# Patient Record
Sex: Female | Born: 1955 | Race: White | Hispanic: No | Smoking: Former smoker
Health system: Southern US, Community
[De-identification: ages and names within clinical notes are randomized; demographics above are authoritative.]

## PROBLEM LIST (undated history)

## (undated) DIAGNOSIS — F419 Anxiety disorder, unspecified: Secondary | ICD-10-CM

## (undated) DIAGNOSIS — M1990 Unspecified osteoarthritis, unspecified site: Secondary | ICD-10-CM

## (undated) DIAGNOSIS — E039 Hypothyroidism, unspecified: Secondary | ICD-10-CM

## (undated) DIAGNOSIS — F32A Depression, unspecified: Secondary | ICD-10-CM

## (undated) DIAGNOSIS — J849 Interstitial pulmonary disease, unspecified: Secondary | ICD-10-CM

## (undated) DIAGNOSIS — J449 Chronic obstructive pulmonary disease, unspecified: Secondary | ICD-10-CM

## (undated) HISTORY — DX: Interstitial pulmonary disease, unspecified: J84.9

## (undated) HISTORY — DX: Hypothyroidism, unspecified: E03.9

## (undated) HISTORY — DX: Chronic obstructive pulmonary disease, unspecified: J44.9

## (undated) HISTORY — PX: OTHER SURGICAL HISTORY: SHX169

## (undated) HISTORY — PX: HIP FRACTURE SURGERY: SHX118

## (undated) HISTORY — DX: Anxiety disorder, unspecified: F41.9

## (undated) HISTORY — DX: Depression, unspecified: F32.A

## (undated) HISTORY — PX: ABDOMINAL HYSTERECTOMY: SHX81

## (undated) HISTORY — DX: Unspecified osteoarthritis, unspecified site: M19.90

---

## 2019-11-30 DIAGNOSIS — H903 Sensorineural hearing loss, bilateral: Secondary | ICD-10-CM | POA: Insufficient documentation

## 2019-12-29 DIAGNOSIS — J449 Chronic obstructive pulmonary disease, unspecified: Secondary | ICD-10-CM | POA: Insufficient documentation

## 2020-02-02 ENCOUNTER — Other Ambulatory Visit: Payer: Self-pay

## 2020-02-02 ENCOUNTER — Emergency Department (HOSPITAL_COMMUNITY)
Admission: EM | Admit: 2020-02-02 | Discharge: 2020-02-03 | Disposition: A | Discharge status: Home / Self Care | Payer: Medicare HMO | Attending: Emergency Medicine | Admitting: Emergency Medicine

## 2020-02-02 DIAGNOSIS — R42 Dizziness and giddiness: Secondary | ICD-10-CM | POA: Diagnosis present

## 2020-02-02 DIAGNOSIS — Z7982 Long term (current) use of aspirin: Secondary | ICD-10-CM | POA: Diagnosis not present

## 2020-02-02 DIAGNOSIS — R0602 Shortness of breath: Secondary | ICD-10-CM | POA: Insufficient documentation

## 2020-02-02 DIAGNOSIS — Z79899 Other long term (current) drug therapy: Secondary | ICD-10-CM | POA: Diagnosis not present

## 2020-02-02 DIAGNOSIS — J189 Pneumonia, unspecified organism: Secondary | ICD-10-CM | POA: Insufficient documentation

## 2020-02-02 LAB — COMPREHENSIVE METABOLIC PANEL
ALT: 14 U/L (ref 0–44)
AST: 20 U/L (ref 15–41)
Albumin: 3.7 g/dL (ref 3.5–5.0)
Alkaline Phosphatase: 58 U/L (ref 38–126)
Anion gap: 11 (ref 5–15)
BUN: 11 mg/dL (ref 8–23)
CO2: 22 mmol/L (ref 22–32)
Calcium: 8.2 mg/dL — ABNORMAL LOW (ref 8.9–10.3)
Chloride: 107 mmol/L (ref 98–111)
Creatinine, Ser: 1.06 mg/dL — ABNORMAL HIGH (ref 0.44–1.00)
GFR calc Af Amer: >60 mL/min (ref >60)
GFR calc non Af Amer: 55 mL/min — ABNORMAL LOW (ref >60)
Glucose, Bld: 97 mg/dL (ref 70–99)
Potassium: 4.2 mmol/L (ref 3.5–5.1)
Sodium: 140 mmol/L (ref 135–145)
Total Bilirubin: 0.8 mg/dL (ref 0.3–1.2)
Total Protein: 6.2 g/dL — ABNORMAL LOW (ref 6.5–8.1)

## 2020-02-02 LAB — CBC WITH DIFFERENTIAL/PLATELET
Abs Immature Granulocytes: 0.05 10*3/uL (ref 0.00–0.07)
Basophils Absolute: 0 10*3/uL (ref 0.0–0.1)
Basophils Relative: 0 %
Eosinophils Absolute: 0.5 10*3/uL (ref 0.0–0.5)
Eosinophils Relative: 4 %
HCT: 33.8 % — ABNORMAL LOW (ref 36.0–46.0)
Hemoglobin: 11 g/dL — ABNORMAL LOW (ref 12.0–15.0)
Immature Granulocytes: 0 %
Lymphocytes Relative: 20 %
Lymphs Abs: 2.5 10*3/uL (ref 0.7–4.0)
MCH: 31.8 pg (ref 26.0–34.0)
MCHC: 32.5 g/dL (ref 30.0–36.0)
MCV: 97.7 fL (ref 80.0–100.0)
Monocytes Absolute: 0.7 10*3/uL (ref 0.1–1.0)
Monocytes Relative: 6 %
Neutro Abs: 8.4 10*3/uL — ABNORMAL HIGH (ref 1.7–7.7)
Neutrophils Relative %: 70 %
Platelets: 191 10*3/uL (ref 150–400)
RBC: 3.46 MIL/uL — ABNORMAL LOW (ref 3.87–5.11)
RDW: 14.5 % (ref 11.5–15.5)
WBC: 12.1 10*3/uL — ABNORMAL HIGH (ref 4.0–10.5)
nRBC: 0 % (ref 0.0–0.2)

## 2020-02-02 LAB — URINALYSIS, ROUTINE W REFLEX MICROSCOPIC
Bilirubin Urine: NEGATIVE
Glucose, UA: NEGATIVE mg/dL
Hgb urine dipstick: NEGATIVE
Ketones, ur: NEGATIVE mg/dL
Leukocytes,Ua: NEGATIVE
Nitrite: NEGATIVE
Protein, ur: NEGATIVE mg/dL
Specific Gravity, Urine: 1.013 (ref 1.005–1.030)
pH: 5 (ref 5.0–8.0)

## 2020-02-02 MED ORDER — SODIUM CHLORIDE 0.9 % IV BOLUS (SEPSIS)
1000.0000 mL | Freq: Once | INTRAVENOUS | Status: AC
  Administered 2020-02-03: 1000 mL via INTRAVENOUS

## 2020-02-02 NOTE — ED Triage Notes (Signed)
PER EMS: Pt is coming from home with c/o dizziness for the past two months. Pt states the dizziness has gotten worse since she was diagnosed with a UTI and pneumonia one month ago. States she was prescribed antibiotics and did not finish the entire prescription. Continues to have difficultly urinating and reports falling more frequently. Dyspnea with exertion. A&Ox4 and ambulatory without assistive device. 3L of oxygen at baseline.   EMS VITALS: BP 124/70 HR 88 SPO2 97% 3L  CBG 101 TEMP 97.3

## 2020-02-03 ENCOUNTER — Emergency Department (HOSPITAL_COMMUNITY): Payer: Medicare HMO

## 2020-02-03 DIAGNOSIS — J189 Pneumonia, unspecified organism: Secondary | ICD-10-CM | POA: Diagnosis not present

## 2020-02-03 LAB — LACTIC ACID, PLASMA: Lactic Acid, Venous: 0.6 mmol/L (ref 0.5–1.9)

## 2020-02-03 LAB — ETHANOL: Alcohol, Ethyl (B): <10 mg/dL (ref <10)

## 2020-02-03 LAB — ACETAMINOPHEN LEVEL: Acetaminophen (Tylenol), Serum: <10 ug/mL — ABNORMAL LOW (ref 10–30)

## 2020-02-03 LAB — SALICYLATE LEVEL: Salicylate Lvl: <7 mg/dL — ABNORMAL LOW (ref 7.0–30.0)

## 2020-02-03 MED ORDER — HYDROCODONE-ACETAMINOPHEN 5-325 MG PO TABS
1.0000 | ORAL_TABLET | Freq: Once | ORAL | Status: AC
  Administered 2020-02-03: 1 via ORAL
  Filled 2020-02-03: qty 1

## 2020-02-03 MED ORDER — AZITHROMYCIN 250 MG PO TABS
250.0000 mg | ORAL_TABLET | Freq: Every day | ORAL | 0 refills | Status: DC
Start: 2020-02-03 — End: 2022-02-26

## 2020-02-03 MED ORDER — SODIUM CHLORIDE 0.9 % IV SOLN
2.0000 g | INTRAVENOUS | Status: DC
  Administered 2020-02-03: 2 g via INTRAVENOUS
  Filled 2020-02-03: qty 20

## 2020-02-03 MED ORDER — LACTATED RINGERS IV BOLUS (SEPSIS)
250.0000 mL | Freq: Once | INTRAVENOUS | Status: AC
  Administered 2020-02-03: 250 mL via INTRAVENOUS

## 2020-02-03 MED ORDER — SODIUM CHLORIDE 0.9 % IV SOLN
500.0000 mg | INTRAVENOUS | Status: DC
  Administered 2020-02-03: 500 mg via INTRAVENOUS
  Filled 2020-02-03: qty 500

## 2020-02-03 MED ORDER — LACTATED RINGERS IV BOLUS (SEPSIS)
1000.0000 mL | Freq: Once | INTRAVENOUS | Status: AC
  Administered 2020-02-03: 1000 mL via INTRAVENOUS

## 2020-02-03 MED ORDER — AMOXICILLIN-POT CLAVULANATE 875-125 MG PO TABS
1.0000 | ORAL_TABLET | Freq: Two times a day (BID) | ORAL | 0 refills | Status: DC
Start: 2020-02-03 — End: 2022-02-26

## 2020-02-03 NOTE — Discharge Instructions (Addendum)
Please obtain a repeat chest x-ray in 1 month to make sure that the infection has improved.

## 2020-02-03 NOTE — ED Notes (Signed)
Patient transported to Ct. 

## 2020-02-03 NOTE — ED Notes (Signed)
Patients SPO2 remained 93-95% on 3L oxygen while ambulating. No complaints of SOB or dizziness. Gait steady. NAD.

## 2020-02-03 NOTE — ED Provider Notes (Signed)
[facility]-EMERGENCY DEPT Provider Note   CSN: [id number] Arrival date & time: 02/02/20  2056     History Chief Complaint  Patient presents with  . Dizziness    Tiffany Wagner is a 64 y.o. female.  The history is provided by the patient.  Dizziness Severity:  Moderate Onset quality:  Gradual Timing:  Intermittent Progression:  Worsening Chronicity:  Recurrent Relieved by: Rest. Worsened by:  Movement Associated symptoms: shortness of breath    Patient presents for multiple complaints.  Patient reports she has had dizziness for several months.  It will come and go at random.  Tonight it occurred while she was walking and her family called 911. She also reports recent shortness of breath and cough.  She also reports previous history of UTI.  She also reports previous history of pneumonia. She also reports that she went to an outside hospital recently and had a full body MRI but does not know the results. Patient did fall tonight, she may have strained her back but no other acute traumatic injury      PMH - migraines OB History   No obstetric history on file.     No family history on file.  Social History   Tobacco Use  . Smoking status: Not on file  Substance Use Topics  . Alcohol use: Not on file  . Drug use: Not on file    Home Medications Prior to Admission medications   Medication Sig Start Date End Date Taking? Authorizing Provider  Aspirin-Salicylamide-Caffeine (BC HEADACHE POWDER PO) Take 1 tablet by mouth daily as needed (headache).   Yes [provider]  LISINOPRIL PO Take by mouth.   Yes [provider]  omeprazole (PRILOSEC) 40 MG capsule Take 40 mg by mouth daily.   Yes [provider]  sertraline (ZOLOFT) 100 MG tablet Take 100 mg by mouth 2 (two) times daily.   Yes [provider]  zolpidem (AMBIEN) 10 MG tablet Take 10 mg by mouth at bedtime as needed for sleep.   Yes [provider]      Allergies    Patient has no known allergies.  Review of Systems   Review of Systems  Constitutional: Positive for fatigue. Negative for fever.  Respiratory: Positive for cough and shortness of breath.   Neurological: Positive for dizziness.  All other systems reviewed and are negative.   Physical Exam Updated Vital Signs BP 104/64 (BP Location: Left Arm)   Pulse 85   Temp 99.1 F (37.3 C) (Oral)   Resp 15   Ht 1.626 m (5\' 4" )   Wt 73.5 kg   SpO2 98%   BMI 27.81 kg/m   Physical Exam CONSTITUTIONAL: Chronically ill-appearing, appears mildly sedated HEAD: Normocephalic/atraumatic EYES: EOMI/PERRL, no nystagmus, no ptosis ENMT: Mucous membranes moist NECK: supple no meningeal signs No cervical spine tenderness, no bruising/crepitance/stepoffs noted to spine CV: S1/S2 noted, no murmurs/rubs/gallops noted LUNGS: Lungs are clear to auscultation bilaterally, no apparent distress ABDOMEN: soft, nontender, no rebound or guarding GU:no cva tenderness NEURO:Awake/alert, face symmetric, no arm or leg drift is noted Equal 5/5 strength with shoulder abduction, elbow flex/extension, wrist flex/extension in upper extremities and equal hand grips bilaterally Equal 5/5 strength with hip flexion,knee flex/extension, foot dorsi/plantar flexion Cranial nerves 3/4/5/6/02/08/09/11/12 tested and intact Sensation to light touch intact in all extremities EXTREMITIES: pulses normal, full ROM, no deformities SKIN: warm, color normal PSYCH: no abnormalities of mood noted  ED Results / Procedures / Treatments  Labs (all labs ordered are listed, but only abnormal results are displayed) Labs Reviewed  CBC WITH DIFFERENTIAL/PLATELET - Abnormal; Notable for the following components:      Result Value   WBC 12.1 (*)    RBC 3.46 (*)    Hemoglobin 11.0 (*)    HCT 33.8 (*)    Neutro Abs 8.4 (*)    All other components within normal limits  COMPREHENSIVE METABOLIC PANEL - Abnormal; Notable for  the following components:   Creatinine, Ser 1.06 (*)    Calcium 8.2 (*)    Total Protein 6.2 (*)    GFR calc non Af Amer 55 (*)    All other components within normal limits  ACETAMINOPHEN LEVEL - Abnormal; Notable for the following components:   Acetaminophen (Tylenol), Serum <10 (*)    All other components within normal limits  SALICYLATE LEVEL - Abnormal; Notable for the following components:   Salicylate Lvl <7.0 (*)    All other components within normal limits  CULTURE, BLOOD (ROUTINE X 2)  CULTURE, BLOOD (ROUTINE X 2)  URINALYSIS, ROUTINE W REFLEX MICROSCOPIC  ETHANOL  LACTIC ACID, PLASMA    EKG EKG Interpretation  Date/Time:  Friday February 02 2020 21:14:27 EDT Ventricular Rate:  85 PR Interval:    QRS Duration: 78 QT Interval:  360 QTC Calculation: 428 R Axis:   9 Text Interpretation: Sinus rhythm No previous ECGs available Confirmed by Zadie Rhine ([id number]) on 02/02/2020 11:40:00 PM   Radiology CT Head Wo Contrast  Result Date: 02/03/2020 CLINICAL DATA:  Nonspecific dizziness for 2 months. EXAM: CT HEAD WITHOUT CONTRAST TECHNIQUE: Contiguous axial images were obtained from the base of the skull through the vertex without intravenous contrast. COMPARISON:  None. FINDINGS: Brain: Mild generalized atrophy and chronic small vessel ischemia. No intracranial hemorrhage, mass effect, or midline shift. No hydrocephalus. The basilar cisterns are patent. No evidence of territorial infarct or acute ischemia. No extra-axial or intracranial fluid collection. Vascular: No hyperdense vessel or unexpected calcification. Skull: No fracture or focal lesion. Sinuses/Orbits: Paranasal sinuses and mastoid air cells are clear. The visualized orbits are unremarkable. No mastoid effusion. Remote right nasal bone fracture. Other: None. IMPRESSION: 1. No acute intracranial abnormality. 2. Mild atrophy and chronic small vessel ischemia. Electronically Signed   By: Narda Rutherford M.D.   On: 02/03/2020  00:50   DG Chest Port 1 View  Result Date: 02/03/2020 CLINICAL DATA:  Shortness of breath.  Dizziness. EXAM: PORTABLE CHEST 1 VIEW COMPARISON:  None. FINDINGS: Heart is normal in size. Normal mediastinal contours. Patchy right suprahilar opacity. No pulmonary edema or pleural effusion. No pneumothorax. No acute osseous abnormalities are seen. Remote distal left clavicle fracture. Surgical hardware in the left proximal humerus. IMPRESSION: Patchy right suprahilar opacity, suspicious for pneumonia. Followup PA and lateral chest X-ray is recommended in 3-4 weeks following trial of antibiotic therapy to ensure resolution and exclude underlying malignancy. Electronically Signed   By: Narda Rutherford M.D.   On: 02/03/2020 00:25    Procedures Procedures   Medications Ordered in ED Medications  cefTRIAXone (ROCEPHIN) 2 g in sodium chloride 0.9 % 100 mL IVPB (0 g Intravenous Stopped 02/03/20 0305)  azithromycin (ZITHROMAX) 500 mg in sodium chloride 0.9 % 250 mL IVPB (0 mg Intravenous Stopped 02/03/20 0334)  sodium chloride 0.9 % bolus 1,000 mL (0 mLs Intravenous Stopped 02/03/20 0118)  lactated ringers bolus 1,000 mL (0 mLs Intravenous Stopped 02/03/20 0305)    And  lactated ringers bolus 1,000 mL (0  mLs Intravenous Stopped 02/03/20 0304)    And  lactated ringers bolus 250 mL (0 mLs Intravenous Stopped 02/03/20 0252)  HYDROcodone-acetaminophen (NORCO/VICODIN) 5-325 MG per tablet 1 tablet (1 tablet Oral Given 02/03/20 0551)    ED Course  I have reviewed the triage vital signs and the nursing notes.  Pertinent labs & imaging results that were available during my care of the patient were reviewed by me and considered in my medical decision making (see chart for details).    MDM Rules/Calculators/A&P                          12:48 AM Patient presents for multiple complaints.  Patient is a poor historian.  She reports dizziness for quite some time and recent evaluation but is unclear what the results were.  She  also reports recent UTI pneumonia.  X-ray tonight is consistent with pneumonia.  Due to the frequent falls and dizziness I did order a CT head.  Work-up is pending at this time. 5:52 AM Patient monitored for several hours.  She did have episodes of hypotension that have since resolved with IV fluids.  Blood pressures improved.  She is not septic appearing, lactate negative.  No focal weakness noted. Patient reports he has had dizziness on and off for several months She is able to ambulate here without ataxia.  Low suspicion for acute stroke No signs of head injury on CT head Patient found to have pneumonia.  She is on oxygen at home, and while ambulating with her oxygen she maintained her pulse ox sats I feel she is appropriate discharge home. She will be started on dual antibiotic therapy.  She is from out of town but is staying locally with family.  She was instructed to get a repeat chest x-ray in 1 month to ensure infection has resolved We discussed strict return precautions She does report generalized soreness from recent falls, but no signs of traumatic injury CT head negative, and no focal neck tenderness Final Clinical Impression(s) / ED Diagnoses Final diagnoses:  Community acquired pneumonia of right lung, unspecified part of lung    Rx / DC Orders ED Discharge Orders         Ordered    amoxicillin-clavulanate (AUGMENTIN) 875-125 MG tablet  2 times daily     Discontinue  Reprint     02/03/20 0549    azithromycin (ZITHROMAX) 250 MG tablet  Daily     Discontinue  Reprint     02/03/20 0549           Zadie Rhine, MD 02/03/20 [phone]

## 2020-02-08 LAB — CULTURE, BLOOD (ROUTINE X 2)
Culture: NO GROWTH
Culture: NO GROWTH
Special Requests: ADEQUATE
Special Requests: ADEQUATE

## 2021-01-22 DIAGNOSIS — F1910 Other psychoactive substance abuse, uncomplicated: Secondary | ICD-10-CM | POA: Insufficient documentation

## 2021-01-22 DIAGNOSIS — I5032 Chronic diastolic (congestive) heart failure: Secondary | ICD-10-CM | POA: Diagnosis present

## 2021-01-22 DIAGNOSIS — I272 Pulmonary hypertension, unspecified: Secondary | ICD-10-CM | POA: Diagnosis present

## 2021-01-22 DIAGNOSIS — I071 Rheumatic tricuspid insufficiency: Secondary | ICD-10-CM | POA: Insufficient documentation

## 2021-01-22 DIAGNOSIS — J9611 Chronic respiratory failure with hypoxia: Secondary | ICD-10-CM | POA: Diagnosis present

## 2021-01-22 DIAGNOSIS — F603 Borderline personality disorder: Secondary | ICD-10-CM | POA: Insufficient documentation

## 2021-01-24 DIAGNOSIS — Z720 Tobacco use: Secondary | ICD-10-CM | POA: Insufficient documentation

## 2021-04-05 DIAGNOSIS — J189 Pneumonia, unspecified organism: Secondary | ICD-10-CM | POA: Insufficient documentation

## 2021-04-23 DIAGNOSIS — Z8709 Personal history of other diseases of the respiratory system: Secondary | ICD-10-CM | POA: Insufficient documentation

## 2021-04-23 DIAGNOSIS — J849 Interstitial pulmonary disease, unspecified: Secondary | ICD-10-CM | POA: Diagnosis present

## 2021-06-02 DIAGNOSIS — R7401 Elevation of levels of liver transaminase levels: Secondary | ICD-10-CM | POA: Insufficient documentation

## 2021-06-10 DIAGNOSIS — K810 Acute cholecystitis: Secondary | ICD-10-CM | POA: Insufficient documentation

## 2021-06-10 DIAGNOSIS — R778 Other specified abnormalities of plasma proteins: Secondary | ICD-10-CM | POA: Insufficient documentation

## 2021-06-10 DIAGNOSIS — R9431 Abnormal electrocardiogram [ECG] [EKG]: Secondary | ICD-10-CM | POA: Diagnosis present

## 2021-07-07 IMAGING — CT CT HEAD W/O CM
3 series · 16 of 47 positions shown, 19 images · non-contrast
Comparison: None.

CLINICAL DATA: Nonspecific dizziness for 2 months.

EXAM:
CT HEAD WITHOUT CONTRAST
TECHNIQUE: Contiguous axial images were obtained from the base of the skull
through the vertex without intravenous contrast.

[Series 2: head wo · axial · 0.43mm/px · z∈[-155,-25]mm · 10 of 32 slices shown, 13 images]
[im 3/32  brain]
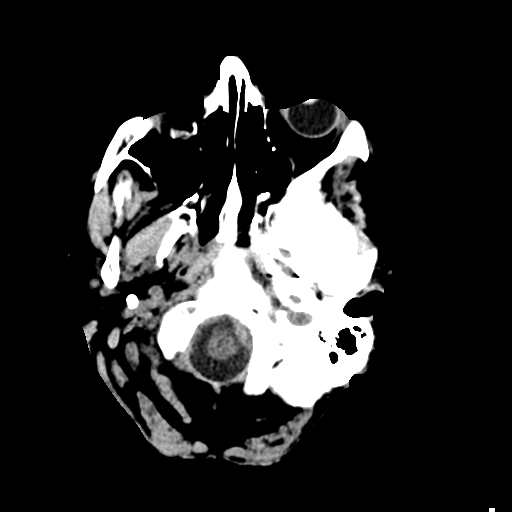
[im 3/32  bone]
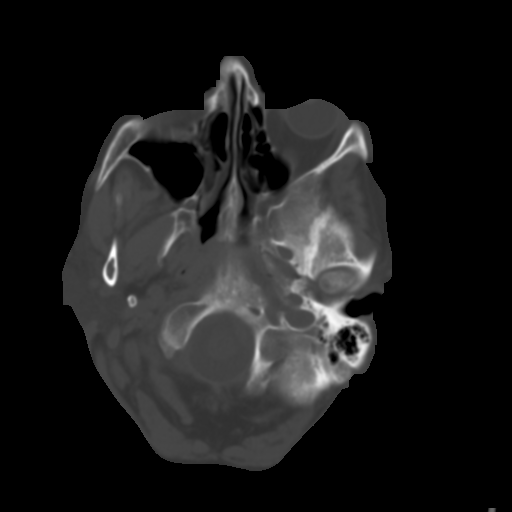
[im 6/32  brain]
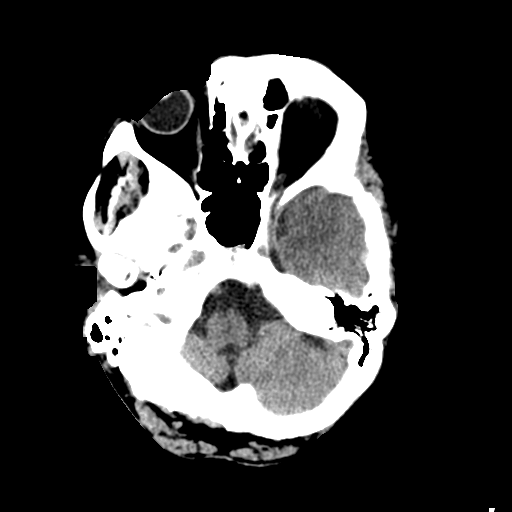
[im 9/32  brain]
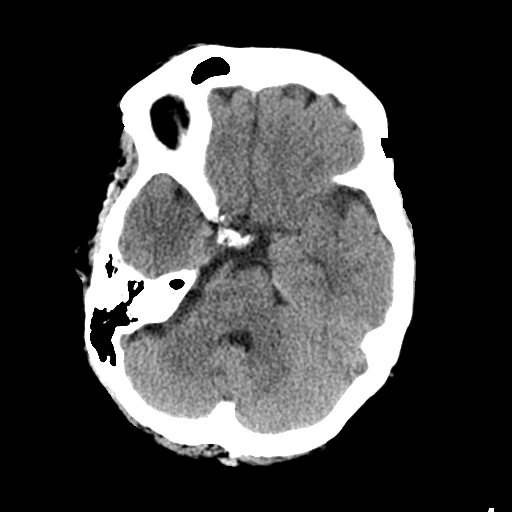
[im 11/32  brain]
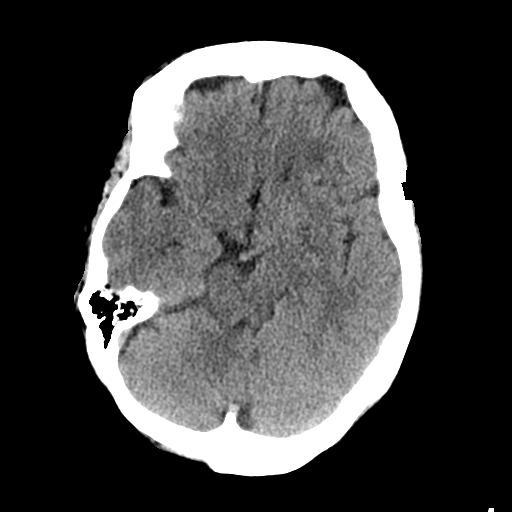
[im 14/32  brain]
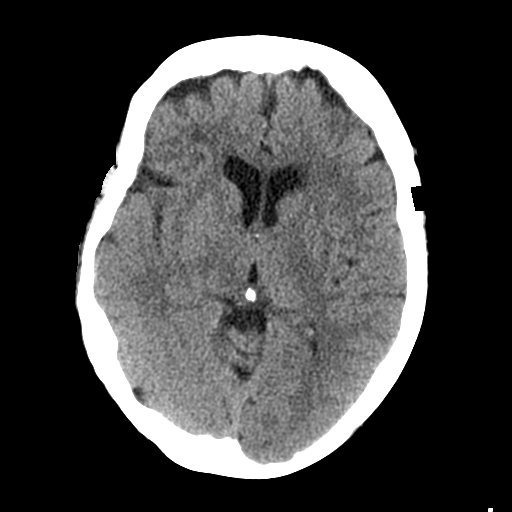
[im 14/32  bone]
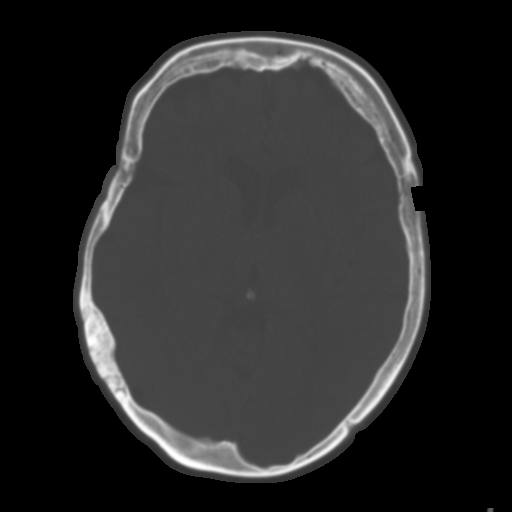
[im 18/32  brain]
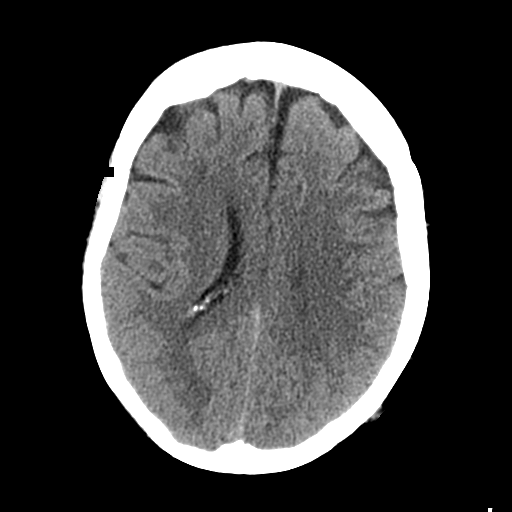
[im 21/32  brain]
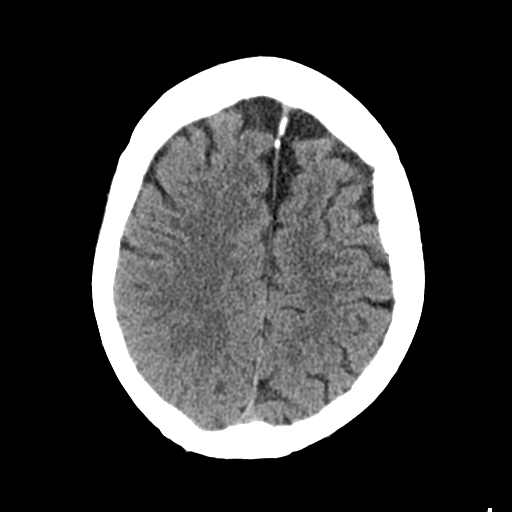
[im 24/32  brain]
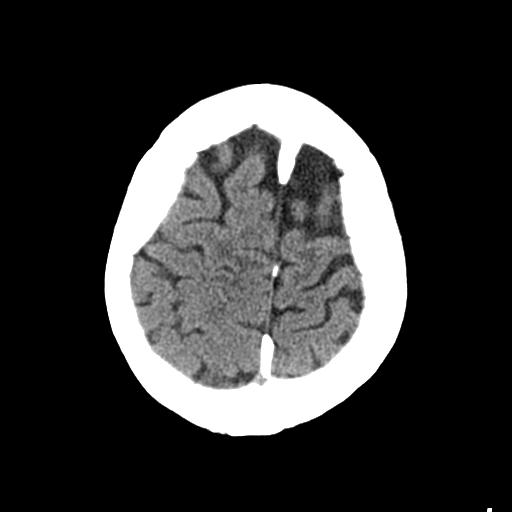
[im 26/32  brain]
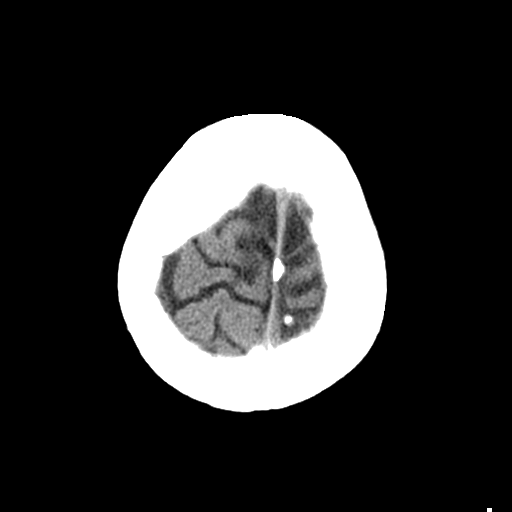
[im 26/32  bone]
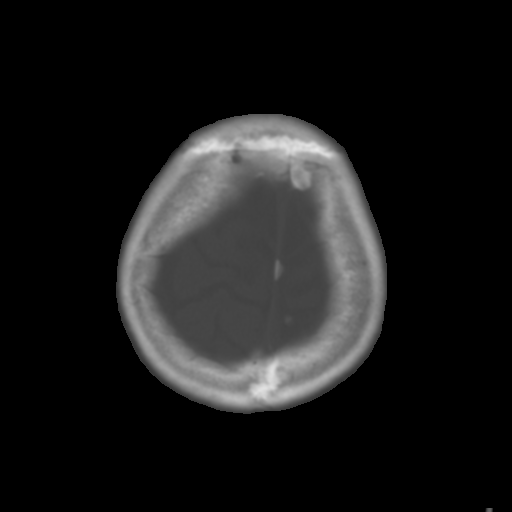
[im 29/32  brain]
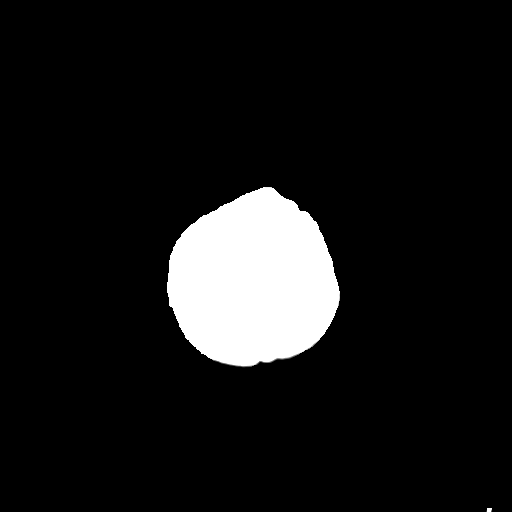

[Series 5: coronal soft tissue · coronal · 0.33mm/px · 3 of 72 slices shown]
[im 24/72  brain]
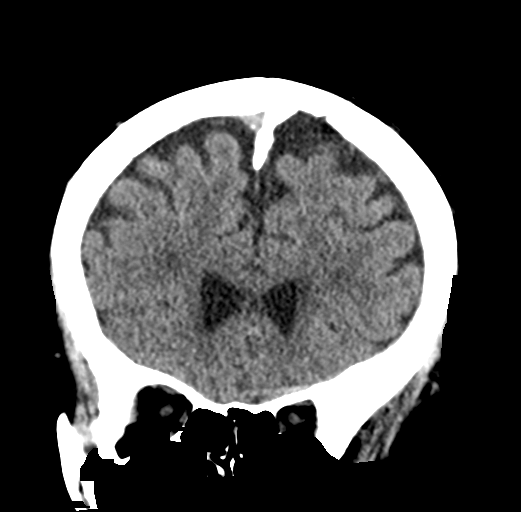
[im 32/72  brain]
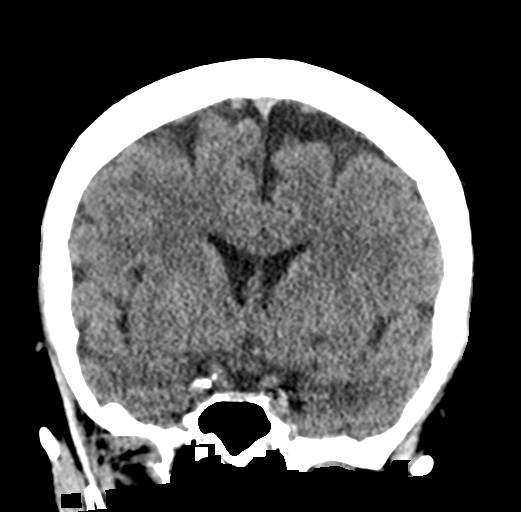
[im 40/72  brain]
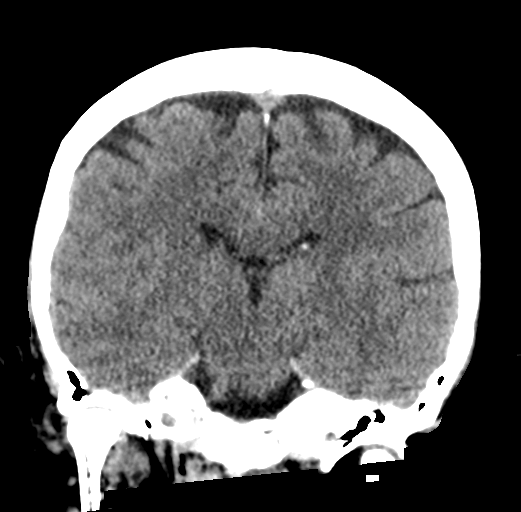

[Series 6: sagittal soft tissue · sagittal · 0.32mm/px · 3 of 54 slices shown]
[im 18/54  brain]
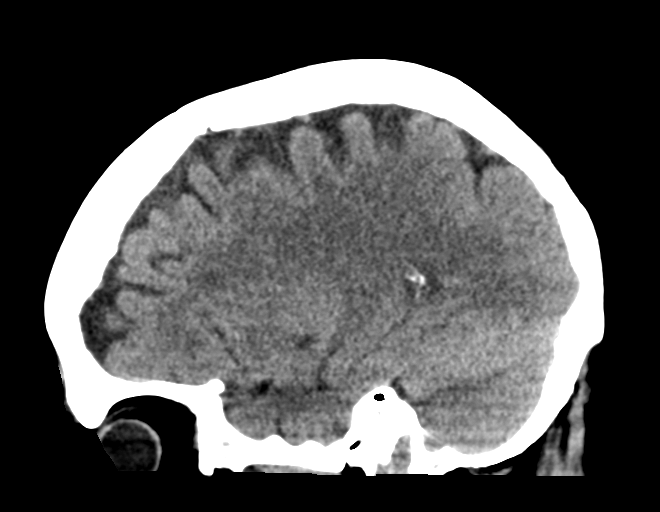
[im 27/54  brain]
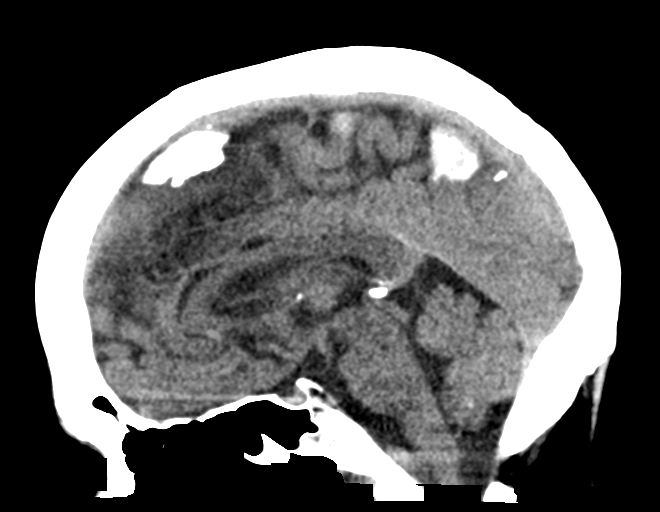
[im 36/54  brain]
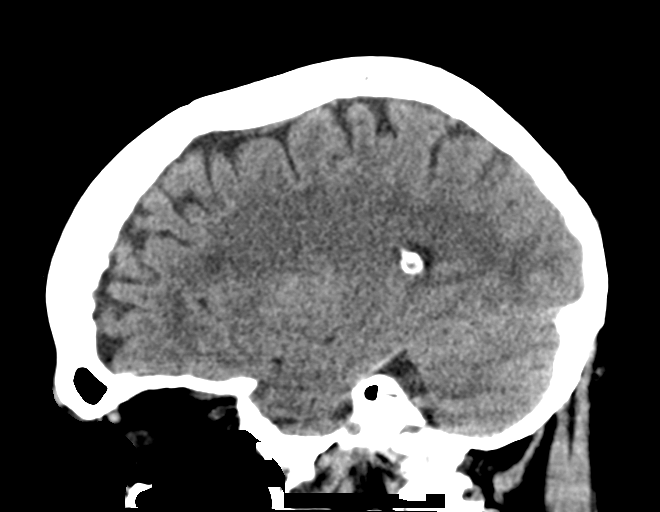

[16 of 47 positions shown; findings below may reference images not displayed]

FINDINGS: Brain: Mild generalized atrophy and chronic small vessel ischemia.
No intracranial hemorrhage, mass effect, or midline shift. No
hydrocephalus. The basilar cisterns are patent. No evidence of
territorial infarct or acute ischemia. No extra-axial or
intracranial fluid collection.

Vascular: No hyperdense vessel or unexpected calcification.

Skull: No fracture or focal lesion.

Sinuses/Orbits: Paranasal sinuses and mastoid air cells are clear.
The visualized orbits are unremarkable. No mastoid effusion. Remote
right nasal bone fracture.

Other: None.
IMPRESSION: 1. No acute intracranial abnormality.
2. Mild atrophy and chronic small vessel ischemia.

## 2021-07-19 DIAGNOSIS — S92515A Nondisplaced fracture of proximal phalanx of left lesser toe(s), initial encounter for closed fracture: Secondary | ICD-10-CM | POA: Insufficient documentation

## 2022-01-28 DIAGNOSIS — J810 Acute pulmonary edema: Secondary | ICD-10-CM | POA: Insufficient documentation

## 2022-02-26 ENCOUNTER — Emergency Department (HOSPITAL_COMMUNITY): Payer: Medicare Other

## 2022-02-26 ENCOUNTER — Inpatient Hospital Stay (HOSPITAL_COMMUNITY)
Admission: EM | Admit: 2022-02-26 | Discharge: 2022-03-04 | DRG: 682 | Discharge status: Against Medical Advice | Payer: Medicare Other | Attending: Internal Medicine | Admitting: Internal Medicine

## 2022-02-26 ENCOUNTER — Other Ambulatory Visit: Payer: Self-pay

## 2022-02-26 ENCOUNTER — Encounter (HOSPITAL_COMMUNITY): Payer: Self-pay

## 2022-02-26 DIAGNOSIS — I5032 Chronic diastolic (congestive) heart failure: Secondary | ICD-10-CM | POA: Diagnosis present

## 2022-02-26 DIAGNOSIS — J84112 Idiopathic pulmonary fibrosis: Secondary | ICD-10-CM | POA: Diagnosis present

## 2022-02-26 DIAGNOSIS — N179 Acute kidney failure, unspecified: Principal | ICD-10-CM | POA: Diagnosis present

## 2022-02-26 DIAGNOSIS — R9431 Abnormal electrocardiogram [ECG] [EKG]: Secondary | ICD-10-CM | POA: Diagnosis present

## 2022-02-26 DIAGNOSIS — Z6829 Body mass index (BMI) 29.0-29.9, adult: Secondary | ICD-10-CM

## 2022-02-26 DIAGNOSIS — Z79899 Other long term (current) drug therapy: Secondary | ICD-10-CM

## 2022-02-26 DIAGNOSIS — F32A Depression, unspecified: Secondary | ICD-10-CM

## 2022-02-26 DIAGNOSIS — J679 Hypersensitivity pneumonitis due to unspecified organic dust: Secondary | ICD-10-CM | POA: Diagnosis present

## 2022-02-26 DIAGNOSIS — J9621 Acute and chronic respiratory failure with hypoxia: Secondary | ICD-10-CM | POA: Diagnosis present

## 2022-02-26 DIAGNOSIS — E039 Hypothyroidism, unspecified: Secondary | ICD-10-CM | POA: Diagnosis present

## 2022-02-26 DIAGNOSIS — Z9981 Dependence on supplemental oxygen: Secondary | ICD-10-CM

## 2022-02-26 DIAGNOSIS — Z79890 Hormone replacement therapy (postmenopausal): Secondary | ICD-10-CM

## 2022-02-26 DIAGNOSIS — K219 Gastro-esophageal reflux disease without esophagitis: Secondary | ICD-10-CM

## 2022-02-26 DIAGNOSIS — E663 Overweight: Secondary | ICD-10-CM | POA: Diagnosis present

## 2022-02-26 DIAGNOSIS — D72829 Elevated white blood cell count, unspecified: Secondary | ICD-10-CM | POA: Diagnosis present

## 2022-02-26 DIAGNOSIS — M1711 Unilateral primary osteoarthritis, right knee: Secondary | ICD-10-CM | POA: Diagnosis present

## 2022-02-26 DIAGNOSIS — F419 Anxiety disorder, unspecified: Secondary | ICD-10-CM | POA: Diagnosis present

## 2022-02-26 DIAGNOSIS — E876 Hypokalemia: Secondary | ICD-10-CM | POA: Diagnosis present

## 2022-02-26 DIAGNOSIS — N390 Urinary tract infection, site not specified: Secondary | ICD-10-CM | POA: Diagnosis present

## 2022-02-26 DIAGNOSIS — D649 Anemia, unspecified: Secondary | ICD-10-CM | POA: Diagnosis present

## 2022-02-26 DIAGNOSIS — Z888 Allergy status to other drugs, medicaments and biological substances status: Secondary | ICD-10-CM

## 2022-02-26 DIAGNOSIS — Z87891 Personal history of nicotine dependence: Secondary | ICD-10-CM

## 2022-02-26 DIAGNOSIS — J9611 Chronic respiratory failure with hypoxia: Secondary | ICD-10-CM | POA: Diagnosis present

## 2022-02-26 DIAGNOSIS — E869 Volume depletion, unspecified: Secondary | ICD-10-CM | POA: Diagnosis present

## 2022-02-26 DIAGNOSIS — I110 Hypertensive heart disease with heart failure: Secondary | ICD-10-CM | POA: Diagnosis present

## 2022-02-26 DIAGNOSIS — J849 Interstitial pulmonary disease, unspecified: Secondary | ICD-10-CM | POA: Diagnosis present

## 2022-02-26 DIAGNOSIS — I272 Pulmonary hypertension, unspecified: Secondary | ICD-10-CM | POA: Diagnosis present

## 2022-02-26 DIAGNOSIS — M79605 Pain in left leg: Secondary | ICD-10-CM | POA: Diagnosis present

## 2022-02-26 DIAGNOSIS — J479 Bronchiectasis, uncomplicated: Secondary | ICD-10-CM | POA: Diagnosis present

## 2022-02-26 LAB — BASIC METABOLIC PANEL
Anion gap: 17 — ABNORMAL HIGH (ref 5–15)
BUN: 30 mg/dL — ABNORMAL HIGH (ref 8–23)
CO2: 34 mmol/L — ABNORMAL HIGH (ref 22–32)
Calcium: 8.8 mg/dL — ABNORMAL LOW (ref 8.9–10.3)
Chloride: 88 mmol/L — ABNORMAL LOW (ref 98–111)
Creatinine, Ser: 2.55 mg/dL — ABNORMAL HIGH (ref 0.44–1.00)
GFR, Estimated: 20 mL/min — ABNORMAL LOW (ref >60)
Glucose, Bld: 124 mg/dL — ABNORMAL HIGH (ref 70–99)
Potassium: 2.5 mmol/L — CL (ref 3.5–5.1)
Sodium: 139 mmol/L (ref 135–145)

## 2022-02-26 LAB — URINALYSIS, ROUTINE W REFLEX MICROSCOPIC
Bilirubin Urine: NEGATIVE
Glucose, UA: NEGATIVE mg/dL
Hgb urine dipstick: NEGATIVE
Ketones, ur: NEGATIVE mg/dL
Nitrite: NEGATIVE
Protein, ur: NEGATIVE mg/dL
Specific Gravity, Urine: 1.01 (ref 1.005–1.030)
pH: 5 (ref 5.0–8.0)

## 2022-02-26 LAB — RAPID URINE DRUG SCREEN, HOSP PERFORMED
Amphetamines: NOT DETECTED
Barbiturates: NOT DETECTED
Benzodiazepines: NOT DETECTED
Cocaine: NOT DETECTED
Opiates: NOT DETECTED
Tetrahydrocannabinol: NOT DETECTED

## 2022-02-26 LAB — CBC WITH DIFFERENTIAL/PLATELET
Abs Immature Granulocytes: 0.17 10*3/uL — ABNORMAL HIGH (ref 0.00–0.07)
Basophils Absolute: 0.1 10*3/uL (ref 0.0–0.1)
Basophils Relative: 0 %
Eosinophils Absolute: 0.4 10*3/uL (ref 0.0–0.5)
Eosinophils Relative: 3 %
HCT: 39.2 % (ref 36.0–46.0)
Hemoglobin: 12.4 g/dL (ref 12.0–15.0)
Immature Granulocytes: 1 %
Lymphocytes Relative: 11 %
Lymphs Abs: 1.7 10*3/uL (ref 0.7–4.0)
MCH: 26.7 pg (ref 26.0–34.0)
MCHC: 31.6 g/dL (ref 30.0–36.0)
MCV: 84.3 fL (ref 80.0–100.0)
Monocytes Absolute: 1.1 10*3/uL — ABNORMAL HIGH (ref 0.1–1.0)
Monocytes Relative: 7 %
Neutro Abs: 11.7 10*3/uL — ABNORMAL HIGH (ref 1.7–7.7)
Neutrophils Relative %: 78 %
Platelets: 203 10*3/uL (ref 150–400)
RBC: 4.65 MIL/uL (ref 3.87–5.11)
RDW: 18.1 % — ABNORMAL HIGH (ref 11.5–15.5)
WBC: 15.1 10*3/uL — ABNORMAL HIGH (ref 4.0–10.5)
nRBC: 0 % (ref 0.0–0.2)

## 2022-02-26 LAB — CK: Total CK: 33 U/L — ABNORMAL LOW (ref 38–234)

## 2022-02-26 LAB — MAGNESIUM: Magnesium: 2.3 mg/dL (ref 1.7–2.4)

## 2022-02-26 LAB — HEPATIC FUNCTION PANEL
ALT: 21 U/L (ref 0–44)
AST: 23 U/L (ref 15–41)
Albumin: 3.8 g/dL (ref 3.5–5.0)
Alkaline Phosphatase: 62 U/L (ref 38–126)
Bilirubin, Direct: <0.1 mg/dL (ref 0.0–0.2)
Total Bilirubin: 0.5 mg/dL (ref 0.3–1.2)
Total Protein: 7.1 g/dL (ref 6.5–8.1)

## 2022-02-26 LAB — LACTIC ACID, PLASMA: Lactic Acid, Venous: 1 mmol/L (ref 0.5–1.9)

## 2022-02-26 MED ORDER — PANTOPRAZOLE SODIUM 40 MG PO TBEC
40.0000 mg | DELAYED_RELEASE_TABLET | Freq: Every day | ORAL | Status: DC
  Administered 2022-02-27 – 2022-03-04 (×6): 40 mg via ORAL
  Filled 2022-02-26 (×7): qty 1

## 2022-02-26 MED ORDER — ENOXAPARIN SODIUM 30 MG/0.3ML IJ SOSY
30.0000 mg | PREFILLED_SYRINGE | INTRAMUSCULAR | Status: DC
Start: 2022-02-26 — End: 2022-02-27
  Administered 2022-02-26: 30 mg via SUBCUTANEOUS
  Filled 2022-02-26: qty 0.3

## 2022-02-26 MED ORDER — SERTRALINE HCL 100 MG PO TABS
200.0000 mg | ORAL_TABLET | Freq: Every day | ORAL | Status: DC
Start: 2022-02-26 — End: 2022-03-04
  Administered 2022-02-26 – 2022-03-03 (×6): 200 mg via ORAL
  Filled 2022-02-26 (×6): qty 2

## 2022-02-26 MED ORDER — POTASSIUM CHLORIDE IN NACL 20-0.9 MEQ/L-% IV SOLN
INTRAVENOUS | Status: DC
  Filled 2022-02-26 (×4): qty 1000

## 2022-02-26 MED ORDER — LEVOTHYROXINE SODIUM 100 MCG PO TABS
100.0000 ug | ORAL_TABLET | Freq: Every day | ORAL | Status: DC
  Administered 2022-02-27 – 2022-03-04 (×6): 100 ug via ORAL
  Filled 2022-02-26 (×6): qty 1

## 2022-02-26 MED ORDER — GABAPENTIN 300 MG PO CAPS
300.0000 mg | ORAL_CAPSULE | Freq: Two times a day (BID) | ORAL | Status: DC
Start: 2022-02-26 — End: 2022-03-04
  Administered 2022-02-26 – 2022-03-04 (×12): 300 mg via ORAL
  Filled 2022-02-26 (×12): qty 1

## 2022-02-26 MED ORDER — ACETAMINOPHEN 650 MG RE SUPP: 650.0000 mg | Freq: Four times a day (QID) | RECTAL | Status: DC | PRN

## 2022-02-26 MED ORDER — PROCHLORPERAZINE EDISYLATE 10 MG/2ML IJ SOLN
5.0000 mg | INTRAMUSCULAR | Status: DC | PRN
  Administered 2022-03-02: 5 mg via INTRAVENOUS
  Filled 2022-02-26: qty 2

## 2022-02-26 MED ORDER — ACETAMINOPHEN 325 MG PO TABS
650.0000 mg | ORAL_TABLET | Freq: Four times a day (QID) | ORAL | Status: DC | PRN
  Administered 2022-02-27 – 2022-03-01 (×5): 650 mg via ORAL
  Filled 2022-02-26 (×5): qty 2

## 2022-02-26 MED ORDER — ONDANSETRON HCL 4 MG/2ML IJ SOLN: 4.0000 mg | Freq: Four times a day (QID) | INTRAMUSCULAR | Status: DC | PRN

## 2022-02-26 MED ORDER — NITROFURANTOIN MACROCRYSTAL 100 MG PO CAPS: 100.0000 mg | ORAL_CAPSULE | Freq: Four times a day (QID) | ORAL | Status: DC

## 2022-02-26 MED ORDER — ONDANSETRON HCL 4 MG PO TABS
4.0000 mg | ORAL_TABLET | Freq: Four times a day (QID) | ORAL | Status: DC | PRN
Start: 2022-02-26 — End: 2022-02-26

## 2022-02-26 MED ORDER — SODIUM CHLORIDE 0.9 % IV BOLUS
1000.0000 mL | Freq: Once | INTRAVENOUS | Status: AC
  Administered 2022-02-26: 1000 mL via INTRAVENOUS

## 2022-02-26 MED ORDER — CIPROFLOXACIN HCL 250 MG PO TABS
250.0000 mg | ORAL_TABLET | Freq: Two times a day (BID) | ORAL | Status: DC
  Administered 2022-02-26 – 2022-02-27 (×2): 250 mg via ORAL
  Filled 2022-02-26 (×2): qty 1

## 2022-02-26 MED ORDER — ZOLPIDEM TARTRATE 5 MG PO TABS
5.0000 mg | ORAL_TABLET | Freq: Every evening | ORAL | Status: DC | PRN
  Administered 2022-02-27 – 2022-03-03 (×5): 5 mg via ORAL
  Filled 2022-02-26 (×6): qty 1

## 2022-02-26 MED ORDER — POTASSIUM CHLORIDE CRYS ER 20 MEQ PO TBCR
40.0000 meq | EXTENDED_RELEASE_TABLET | Freq: Once | ORAL | Status: AC
  Administered 2022-02-26: 40 meq via ORAL
  Filled 2022-02-26: qty 2

## 2022-02-26 MED ORDER — LACTATED RINGERS IV BOLUS
1000.0000 mL | Freq: Once | INTRAVENOUS | Status: AC
  Administered 2022-02-26: 1000 mL via INTRAVENOUS

## 2022-02-26 MED ORDER — DOCUSATE SODIUM 100 MG PO CAPS
100.0000 mg | ORAL_CAPSULE | Freq: Every day | ORAL | Status: DC
  Administered 2022-03-02 – 2022-03-04 (×2): 100 mg via ORAL
  Filled 2022-02-26 (×5): qty 1

## 2022-02-26 MED ORDER — POTASSIUM CHLORIDE 10 MEQ/100ML IV SOLN
10.0000 meq | Freq: Once | INTRAVENOUS | Status: AC
  Administered 2022-02-26: 10 meq via INTRAVENOUS
  Filled 2022-02-26: qty 100

## 2022-02-26 MED ORDER — ALPRAZOLAM 0.25 MG PO TABS
0.2500 mg | ORAL_TABLET | Freq: Once | ORAL | Status: AC
  Administered 2022-02-26: 0.25 mg via ORAL
  Filled 2022-02-26: qty 1

## 2022-02-26 MED ORDER — DILTIAZEM HCL ER COATED BEADS 120 MG PO CP24
120.0000 mg | ORAL_CAPSULE | Freq: Every day | ORAL | Status: DC
  Administered 2022-02-27 – 2022-03-04 (×6): 120 mg via ORAL
  Filled 2022-02-26 (×6): qty 1

## 2022-02-26 NOTE — Progress Notes (Addendum)
Chaplain received a consult that patient wanted to assign her sister as HCPOA.  Chaplain spoke with Tiffany Wagner and let her know that they could work on the paperwork once she is admitted to the floor.  Chaplain will follow up tomorrow.  [address] Bcc Pager, [phone]

## 2022-02-26 NOTE — ED Notes (Signed)
Purewick placed; pt aware urine sample is needed.
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
## 2022-02-26 NOTE — Progress Notes (Signed)
CSW called [employer] # [id number] for the Pt to receive a new regulator. [employer] will deliver to the RM.

## 2022-02-26 NOTE — Progress Notes (Addendum)
Chaplain provided emotional support and prayer, at [facility] request.   Chaplain Dyanne Carrel, Bcc Pager, [phone]
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
## 2022-02-27 DIAGNOSIS — N179 Acute kidney failure, unspecified: Secondary | ICD-10-CM

## 2022-02-27 LAB — MAGNESIUM: Magnesium: 2.3 mg/dL (ref 1.7–2.4)

## 2022-02-27 LAB — URINE CULTURE

## 2022-02-27 LAB — CBC
HCT: 34.4 % — ABNORMAL LOW (ref 36.0–46.0)
Hemoglobin: 10.7 g/dL — ABNORMAL LOW (ref 12.0–15.0)
MCH: 26.9 pg (ref 26.0–34.0)
MCHC: 31.1 g/dL (ref 30.0–36.0)
MCV: 86.4 fL (ref 80.0–100.0)
Platelets: 146 10*3/uL — ABNORMAL LOW (ref 150–400)
RBC: 3.98 MIL/uL (ref 3.87–5.11)
RDW: 18 % — ABNORMAL HIGH (ref 11.5–15.5)
WBC: 8.7 10*3/uL (ref 4.0–10.5)
nRBC: 0 % (ref 0.0–0.2)

## 2022-02-27 LAB — COMPREHENSIVE METABOLIC PANEL
ALT: 18 U/L (ref 0–44)
AST: 20 U/L (ref 15–41)
Albumin: 3.1 g/dL — ABNORMAL LOW (ref 3.5–5.0)
Alkaline Phosphatase: 51 U/L (ref 38–126)
Anion gap: 8 (ref 5–15)
BUN: 21 mg/dL (ref 8–23)
CO2: 34 mmol/L — ABNORMAL HIGH (ref 22–32)
Calcium: 8.3 mg/dL — ABNORMAL LOW (ref 8.9–10.3)
Chloride: 102 mmol/L (ref 98–111)
Creatinine, Ser: 1.41 mg/dL — ABNORMAL HIGH (ref 0.44–1.00)
GFR, Estimated: 41 mL/min — ABNORMAL LOW (ref >60)
Glucose, Bld: 107 mg/dL — ABNORMAL HIGH (ref 70–99)
Potassium: 2.8 mmol/L — ABNORMAL LOW (ref 3.5–5.1)
Sodium: 144 mmol/L (ref 135–145)
Total Bilirubin: 0.4 mg/dL (ref 0.3–1.2)
Total Protein: 5.9 g/dL — ABNORMAL LOW (ref 6.5–8.1)

## 2022-02-27 LAB — POTASSIUM: Potassium: 3.7 mmol/L (ref 3.5–5.1)

## 2022-02-27 LAB — HIV ANTIBODY (ROUTINE TESTING W REFLEX): HIV Screen 4th Generation wRfx: NONREACTIVE

## 2022-02-27 MED ORDER — POTASSIUM CHLORIDE CRYS ER 20 MEQ PO TBCR
40.0000 meq | EXTENDED_RELEASE_TABLET | Freq: Once | ORAL | Status: AC
  Administered 2022-02-27: 40 meq via ORAL
  Filled 2022-02-27: qty 2

## 2022-02-27 MED ORDER — HYDROCODONE-ACETAMINOPHEN 5-325 MG PO TABS
1.0000 | ORAL_TABLET | Freq: Four times a day (QID) | ORAL | Status: DC | PRN
  Administered 2022-02-27 – 2022-03-04 (×18): 1 via ORAL
  Filled 2022-02-27 (×19): qty 1

## 2022-02-27 MED ORDER — ALPRAZOLAM 0.25 MG PO TABS
0.2500 mg | ORAL_TABLET | Freq: Once | ORAL | Status: AC
  Administered 2022-02-27: 0.25 mg via ORAL
  Filled 2022-02-27: qty 1

## 2022-02-27 MED ORDER — ENOXAPARIN SODIUM 40 MG/0.4ML IJ SOSY
40.0000 mg | PREFILLED_SYRINGE | INTRAMUSCULAR | Status: DC
  Administered 2022-02-27 – 2022-03-03 (×5): 40 mg via SUBCUTANEOUS
  Filled 2022-02-27 (×5): qty 0.4

## 2022-02-27 MED ORDER — HYDROXYZINE HCL 10 MG PO TABS
10.0000 mg | ORAL_TABLET | Freq: Three times a day (TID) | ORAL | Status: DC | PRN
Start: 2022-02-27 — End: 2022-03-04
  Administered 2022-02-27 – 2022-03-04 (×5): 10 mg via ORAL
  Filled 2022-02-27 (×5): qty 1

## 2022-02-27 MED ORDER — POTASSIUM CHLORIDE 10 MEQ/100ML IV SOLN
10.0000 meq | INTRAVENOUS | Status: AC
  Administered 2022-02-27 (×4): 10 meq via INTRAVENOUS
  Filled 2022-02-27 (×4): qty 100
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
## 2022-02-27 NOTE — Progress Notes (Signed)
Chaplain met with Tiffany Wagner regarding HCPOA paperwork, but she was very tired.  Chaplain left the paperwork for her to review and will attempt to meet with Tiffany Wagner later today.   Chaplain Katy Claussen, Bcc Pager, [phone] 
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
## 2022-02-27 NOTE — Progress Notes (Signed)
Chaplain attempted f/u about HCPOA paperwork, but pt was having dinner.  Chaplain explained how to fill it out and that she could get it notarized outside of the hospital when she discharges as well.  Chaplain will follow up Monday if she is still a patient.  [address] Bcc Pager, [phone]

## 2022-02-28 DIAGNOSIS — N179 Acute kidney failure, unspecified: Secondary | ICD-10-CM | POA: Diagnosis not present

## 2022-02-28 LAB — BASIC METABOLIC PANEL
Anion gap: 4 — ABNORMAL LOW (ref 5–15)
BUN: 7 mg/dL — ABNORMAL LOW (ref 8–23)
CO2: 28 mmol/L (ref 22–32)
Calcium: 8 mg/dL — ABNORMAL LOW (ref 8.9–10.3)
Chloride: 111 mmol/L (ref 98–111)
Creatinine, Ser: 0.7 mg/dL (ref 0.44–1.00)
GFR, Estimated: >60 mL/min (ref >60)
Glucose, Bld: 101 mg/dL — ABNORMAL HIGH (ref 70–99)
Potassium: 4.6 mmol/L (ref 3.5–5.1)
Sodium: 143 mmol/L (ref 135–145)

## 2022-02-28 LAB — PROCALCITONIN: Procalcitonin: <0.1 ng/mL

## 2022-02-28 NOTE — Progress Notes (Signed)
PT Cancellation Note  Patient Details Name: Sheralee Qazi MRN: [id number] DOB: 10-Aug-1955   Cancelled Treatment:    Reason Eval/Treat Not Completed: Patient declined, no reason specified. Attempted PT eval-pt declined to participate at this time. Will check back as schedule allows. Thanks.    Faye Ramsay, PT Acute Rehabilitation  Office: [phone] Pager: [phone]
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
## 2022-03-01 DIAGNOSIS — K2100 Gastro-esophageal reflux disease with esophagitis, without bleeding: Secondary | ICD-10-CM

## 2022-03-01 DIAGNOSIS — J849 Interstitial pulmonary disease, unspecified: Secondary | ICD-10-CM

## 2022-03-01 DIAGNOSIS — I5032 Chronic diastolic (congestive) heart failure: Secondary | ICD-10-CM | POA: Diagnosis not present

## 2022-03-01 DIAGNOSIS — E876 Hypokalemia: Secondary | ICD-10-CM

## 2022-03-01 DIAGNOSIS — F32A Depression, unspecified: Secondary | ICD-10-CM

## 2022-03-01 DIAGNOSIS — J9611 Chronic respiratory failure with hypoxia: Secondary | ICD-10-CM

## 2022-03-01 DIAGNOSIS — D72829 Elevated white blood cell count, unspecified: Secondary | ICD-10-CM

## 2022-03-01 DIAGNOSIS — N179 Acute kidney failure, unspecified: Secondary | ICD-10-CM | POA: Diagnosis not present

## 2022-03-01 DIAGNOSIS — F419 Anxiety disorder, unspecified: Secondary | ICD-10-CM | POA: Diagnosis not present

## 2022-03-01 DIAGNOSIS — I272 Pulmonary hypertension, unspecified: Secondary | ICD-10-CM

## 2022-03-01 DIAGNOSIS — R9431 Abnormal electrocardiogram [ECG] [EKG]: Secondary | ICD-10-CM

## 2022-03-01 LAB — CBC WITH DIFFERENTIAL/PLATELET
Abs Immature Granulocytes: 0.09 10*3/uL — ABNORMAL HIGH (ref 0.00–0.07)
Basophils Absolute: 0 10*3/uL (ref 0.0–0.1)
Basophils Relative: 0 %
Eosinophils Absolute: 0.3 10*3/uL (ref 0.0–0.5)
Eosinophils Relative: 3 %
HCT: 33.7 % — ABNORMAL LOW (ref 36.0–46.0)
Hemoglobin: 10.4 g/dL — ABNORMAL LOW (ref 12.0–15.0)
Immature Granulocytes: 1 %
Lymphocytes Relative: 12 %
Lymphs Abs: 1.2 10*3/uL (ref 0.7–4.0)
MCH: 26.7 pg (ref 26.0–34.0)
MCHC: 30.9 g/dL (ref 30.0–36.0)
MCV: 86.6 fL (ref 80.0–100.0)
Monocytes Absolute: 0.6 10*3/uL (ref 0.1–1.0)
Monocytes Relative: 6 %
Neutro Abs: 7.8 10*3/uL — ABNORMAL HIGH (ref 1.7–7.7)
Neutrophils Relative %: 78 %
Platelets: 159 10*3/uL (ref 150–400)
RBC: 3.89 MIL/uL (ref 3.87–5.11)
RDW: 18.7 % — ABNORMAL HIGH (ref 11.5–15.5)
WBC: 10 10*3/uL (ref 4.0–10.5)
nRBC: 0 % (ref 0.0–0.2)

## 2022-03-01 LAB — COMPREHENSIVE METABOLIC PANEL
ALT: 18 U/L (ref 0–44)
AST: 19 U/L (ref 15–41)
Albumin: 3 g/dL — ABNORMAL LOW (ref 3.5–5.0)
Alkaline Phosphatase: 52 U/L (ref 38–126)
Anion gap: 8 (ref 5–15)
BUN: 8 mg/dL (ref 8–23)
CO2: 24 mmol/L (ref 22–32)
Calcium: 8.4 mg/dL — ABNORMAL LOW (ref 8.9–10.3)
Chloride: 107 mmol/L (ref 98–111)
Creatinine, Ser: 0.64 mg/dL (ref 0.44–1.00)
GFR, Estimated: >60 mL/min (ref >60)
Glucose, Bld: 97 mg/dL (ref 70–99)
Potassium: 4.6 mmol/L (ref 3.5–5.1)
Sodium: 139 mmol/L (ref 135–145)
Total Bilirubin: 0.6 mg/dL (ref 0.3–1.2)
Total Protein: 5.7 g/dL — ABNORMAL LOW (ref 6.5–8.1)

## 2022-03-01 LAB — MAGNESIUM: Magnesium: 2.2 mg/dL (ref 1.7–2.4)

## 2022-03-01 LAB — PROCALCITONIN: Procalcitonin: <0.1 ng/mL

## 2022-03-01 LAB — PHOSPHORUS: Phosphorus: 2.4 mg/dL — ABNORMAL LOW (ref 2.5–4.6)

## 2022-03-01 MED ORDER — K PHOS MONO-SOD PHOS DI & MONO 155-852-130 MG PO TABS
500.0000 mg | ORAL_TABLET | Freq: Once | ORAL | Status: AC
  Administered 2022-03-01: 500 mg via ORAL
  Filled 2022-03-01: qty 2
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
## 2022-03-02 ENCOUNTER — Observation Stay (HOSPITAL_COMMUNITY): Payer: Medicare Other

## 2022-03-02 DIAGNOSIS — Z9981 Dependence on supplemental oxygen: Secondary | ICD-10-CM | POA: Diagnosis not present

## 2022-03-02 DIAGNOSIS — N179 Acute kidney failure, unspecified: Secondary | ICD-10-CM | POA: Diagnosis present

## 2022-03-02 DIAGNOSIS — Z888 Allergy status to other drugs, medicaments and biological substances status: Secondary | ICD-10-CM | POA: Diagnosis not present

## 2022-03-02 DIAGNOSIS — I5032 Chronic diastolic (congestive) heart failure: Secondary | ICD-10-CM | POA: Diagnosis present

## 2022-03-02 DIAGNOSIS — R9431 Abnormal electrocardiogram [ECG] [EKG]: Secondary | ICD-10-CM | POA: Diagnosis present

## 2022-03-02 DIAGNOSIS — K219 Gastro-esophageal reflux disease without esophagitis: Secondary | ICD-10-CM | POA: Diagnosis present

## 2022-03-02 DIAGNOSIS — I272 Pulmonary hypertension, unspecified: Secondary | ICD-10-CM | POA: Diagnosis present

## 2022-03-02 DIAGNOSIS — M79605 Pain in left leg: Secondary | ICD-10-CM | POA: Diagnosis present

## 2022-03-02 DIAGNOSIS — J84112 Idiopathic pulmonary fibrosis: Secondary | ICD-10-CM | POA: Diagnosis present

## 2022-03-02 DIAGNOSIS — J679 Hypersensitivity pneumonitis due to unspecified organic dust: Secondary | ICD-10-CM | POA: Diagnosis present

## 2022-03-02 DIAGNOSIS — I70229 Atherosclerosis of native arteries of extremities with rest pain, unspecified extremity: Secondary | ICD-10-CM

## 2022-03-02 DIAGNOSIS — F32A Depression, unspecified: Secondary | ICD-10-CM | POA: Diagnosis present

## 2022-03-02 DIAGNOSIS — E039 Hypothyroidism, unspecified: Secondary | ICD-10-CM | POA: Diagnosis present

## 2022-03-02 DIAGNOSIS — N390 Urinary tract infection, site not specified: Secondary | ICD-10-CM | POA: Diagnosis present

## 2022-03-02 DIAGNOSIS — Z87891 Personal history of nicotine dependence: Secondary | ICD-10-CM | POA: Diagnosis not present

## 2022-03-02 DIAGNOSIS — Z79890 Hormone replacement therapy (postmenopausal): Secondary | ICD-10-CM | POA: Diagnosis not present

## 2022-03-02 DIAGNOSIS — I110 Hypertensive heart disease with heart failure: Secondary | ICD-10-CM | POA: Diagnosis present

## 2022-03-02 DIAGNOSIS — E876 Hypokalemia: Secondary | ICD-10-CM | POA: Diagnosis present

## 2022-03-02 DIAGNOSIS — J479 Bronchiectasis, uncomplicated: Secondary | ICD-10-CM | POA: Diagnosis present

## 2022-03-02 DIAGNOSIS — E869 Volume depletion, unspecified: Secondary | ICD-10-CM | POA: Diagnosis present

## 2022-03-02 DIAGNOSIS — F419 Anxiety disorder, unspecified: Secondary | ICD-10-CM | POA: Diagnosis present

## 2022-03-02 DIAGNOSIS — Z79899 Other long term (current) drug therapy: Secondary | ICD-10-CM | POA: Diagnosis not present

## 2022-03-02 DIAGNOSIS — J9611 Chronic respiratory failure with hypoxia: Secondary | ICD-10-CM | POA: Diagnosis present

## 2022-03-02 DIAGNOSIS — J9621 Acute and chronic respiratory failure with hypoxia: Secondary | ICD-10-CM | POA: Diagnosis present

## 2022-03-02 DIAGNOSIS — K2100 Gastro-esophageal reflux disease with esophagitis, without bleeding: Secondary | ICD-10-CM | POA: Diagnosis not present

## 2022-03-02 DIAGNOSIS — D649 Anemia, unspecified: Secondary | ICD-10-CM | POA: Diagnosis present

## 2022-03-02 LAB — COMPREHENSIVE METABOLIC PANEL
ALT: 18 U/L (ref 0–44)
AST: 18 U/L (ref 15–41)
Albumin: 3 g/dL — ABNORMAL LOW (ref 3.5–5.0)
Alkaline Phosphatase: 55 U/L (ref 38–126)
Anion gap: 8 (ref 5–15)
BUN: 8 mg/dL (ref 8–23)
CO2: 23 mmol/L (ref 22–32)
Calcium: 8.9 mg/dL (ref 8.9–10.3)
Chloride: 109 mmol/L (ref 98–111)
Creatinine, Ser: 0.62 mg/dL (ref 0.44–1.00)
GFR, Estimated: >60 mL/min (ref >60)
Glucose, Bld: 85 mg/dL (ref 70–99)
Potassium: 4.1 mmol/L (ref 3.5–5.1)
Sodium: 140 mmol/L (ref 135–145)
Total Bilirubin: 0.7 mg/dL (ref 0.3–1.2)
Total Protein: 5.8 g/dL — ABNORMAL LOW (ref 6.5–8.1)

## 2022-03-02 LAB — CBC WITH DIFFERENTIAL/PLATELET
Abs Immature Granulocytes: 0.1 10*3/uL — ABNORMAL HIGH (ref 0.00–0.07)
Basophils Absolute: 0 10*3/uL (ref 0.0–0.1)
Basophils Relative: 0 %
Eosinophils Absolute: 0.4 10*3/uL (ref 0.0–0.5)
Eosinophils Relative: 4 %
HCT: 35.8 % — ABNORMAL LOW (ref 36.0–46.0)
Hemoglobin: 11.3 g/dL — ABNORMAL LOW (ref 12.0–15.0)
Immature Granulocytes: 1 %
Lymphocytes Relative: 15 %
Lymphs Abs: 1.4 10*3/uL (ref 0.7–4.0)
MCH: 26.8 pg (ref 26.0–34.0)
MCHC: 31.6 g/dL (ref 30.0–36.0)
MCV: 84.8 fL (ref 80.0–100.0)
Monocytes Absolute: 0.6 10*3/uL (ref 0.1–1.0)
Monocytes Relative: 6 %
Neutro Abs: 7 10*3/uL (ref 1.7–7.7)
Neutrophils Relative %: 74 %
Platelets: 176 10*3/uL (ref 150–400)
RBC: 4.22 MIL/uL (ref 3.87–5.11)
RDW: 18.7 % — ABNORMAL HIGH (ref 11.5–15.5)
WBC: 9.6 10*3/uL (ref 4.0–10.5)
nRBC: 0 % (ref 0.0–0.2)

## 2022-03-02 LAB — PHOSPHORUS: Phosphorus: 2.7 mg/dL (ref 2.5–4.6)

## 2022-03-02 LAB — MAGNESIUM: Magnesium: 1.8 mg/dL (ref 1.7–2.4)

## 2022-03-02 LAB — PROCALCITONIN: Procalcitonin: <0.1 ng/mL

## 2022-03-02 MED ORDER — MAGNESIUM SULFATE 2 GM/50ML IV SOLN
2.0000 g | Freq: Once | INTRAVENOUS | Status: AC
  Administered 2022-03-02: 2 g via INTRAVENOUS
  Filled 2022-03-02: qty 50
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
## 2022-03-02 NOTE — Progress Notes (Signed)
ABI's have been completed. Preliminary results can be found in CV Proc through chart review.   03/02/22 12:55 PM Olen Cordial RVT

## 2022-03-03 ENCOUNTER — Inpatient Hospital Stay (HOSPITAL_COMMUNITY): Payer: Medicare Other

## 2022-03-03 DIAGNOSIS — I5032 Chronic diastolic (congestive) heart failure: Secondary | ICD-10-CM | POA: Diagnosis not present

## 2022-03-03 DIAGNOSIS — J9611 Chronic respiratory failure with hypoxia: Secondary | ICD-10-CM | POA: Diagnosis not present

## 2022-03-03 DIAGNOSIS — N179 Acute kidney failure, unspecified: Secondary | ICD-10-CM | POA: Diagnosis not present

## 2022-03-03 DIAGNOSIS — K2100 Gastro-esophageal reflux disease with esophagitis, without bleeding: Secondary | ICD-10-CM | POA: Diagnosis not present

## 2022-03-03 LAB — COMPREHENSIVE METABOLIC PANEL
ALT: 17 U/L (ref 0–44)
AST: 17 U/L (ref 15–41)
Albumin: 3 g/dL — ABNORMAL LOW (ref 3.5–5.0)
Alkaline Phosphatase: 56 U/L (ref 38–126)
Anion gap: 6 (ref 5–15)
BUN: 7 mg/dL — ABNORMAL LOW (ref 8–23)
CO2: 26 mmol/L (ref 22–32)
Calcium: 8.6 mg/dL — ABNORMAL LOW (ref 8.9–10.3)
Chloride: 106 mmol/L (ref 98–111)
Creatinine, Ser: 0.67 mg/dL (ref 0.44–1.00)
GFR, Estimated: >60 mL/min (ref >60)
Glucose, Bld: 132 mg/dL — ABNORMAL HIGH (ref 70–99)
Potassium: 3.9 mmol/L (ref 3.5–5.1)
Sodium: 138 mmol/L (ref 135–145)
Total Bilirubin: 0.5 mg/dL (ref 0.3–1.2)
Total Protein: 5.8 g/dL — ABNORMAL LOW (ref 6.5–8.1)

## 2022-03-03 LAB — CBC WITH DIFFERENTIAL/PLATELET
Abs Immature Granulocytes: 0.12 10*3/uL — ABNORMAL HIGH (ref 0.00–0.07)
Basophils Absolute: 0 10*3/uL (ref 0.0–0.1)
Basophils Relative: 1 %
Eosinophils Absolute: 0.5 10*3/uL (ref 0.0–0.5)
Eosinophils Relative: 5 %
HCT: 36.2 % (ref 36.0–46.0)
Hemoglobin: 11.3 g/dL — ABNORMAL LOW (ref 12.0–15.0)
Immature Granulocytes: 2 %
Lymphocytes Relative: 19 %
Lymphs Abs: 1.6 10*3/uL (ref 0.7–4.0)
MCH: 27 pg (ref 26.0–34.0)
MCHC: 31.2 g/dL (ref 30.0–36.0)
MCV: 86.4 fL (ref 80.0–100.0)
Monocytes Absolute: 0.5 10*3/uL (ref 0.1–1.0)
Monocytes Relative: 6 %
Neutro Abs: 5.6 10*3/uL (ref 1.7–7.7)
Neutrophils Relative %: 67 %
Platelets: 196 10*3/uL (ref 150–400)
RBC: 4.19 MIL/uL (ref 3.87–5.11)
RDW: 18.6 % — ABNORMAL HIGH (ref 11.5–15.5)
WBC: 8.3 10*3/uL (ref 4.0–10.5)
nRBC: 0 % (ref 0.0–0.2)

## 2022-03-03 LAB — CULTURE, BLOOD (ROUTINE X 2)
Culture: NO GROWTH
Culture: NO GROWTH
Special Requests: ADEQUATE

## 2022-03-03 LAB — PHOSPHORUS: Phosphorus: 3.2 mg/dL (ref 2.5–4.6)

## 2022-03-03 LAB — MAGNESIUM: Magnesium: 2.1 mg/dL (ref 1.7–2.4)

## 2022-03-03 LAB — PROCALCITONIN: Procalcitonin: <0.1 ng/mL

## 2022-03-03 MED ORDER — BUTALBITAL-APAP-CAFFEINE 50-325-40 MG PO TABS
1.0000 | ORAL_TABLET | Freq: Four times a day (QID) | ORAL | Status: DC | PRN
  Administered 2022-03-03 – 2022-03-04 (×3): 1 via ORAL
  Filled 2022-03-03 (×3): qty 1

## 2022-03-03 NOTE — Progress Notes (Signed)
Chaplain attempted to follow up with Tiffany Wagner regarding HCPOA paperwork.  She requested that we wait until tomorrow.  [address] Bcc Pager, [phone]

## 2022-03-03 NOTE — TOC Progression Note (Signed)
Transition of Care [facility]) - Progression Note    Patient Details  Name: Tiffany Wagner MRN: [id number] Date of Birth: 1955-12-06  Transition of [facility] CM/SW Contact  Golda Acre, RN Phone Number: 03/03/2022, 11:13 AM  Clinical Narrative:    [employer] for home and travel o2 needs.  Spoke with ashely at [employer]. Will supply travel o2 for the patient.  Per the patient she has no way of getting back to her car and can not afford taxi service.  Wanted an uber and told her that the hsopital does not provide uber transportation.  Unable to take a bus due to the o2.   Expected Discharge Plan: Home/Self Care Barriers to Discharge: Continued Medical Work up  Expected Discharge Plan and Services Expected Discharge Plan: Home/Self Care   Discharge Planning Services: CM Consult   Living arrangements for the past 2 months: Single Family Home                                       Social Determinants of Health (SDOH) Interventions    Readmission Risk Interventions     No data to display
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
## 2022-03-03 NOTE — Progress Notes (Signed)
Chaplain received a page that Tiffany Wagner wanted to speak.  When chaplain returned, Tiffany Wagner was asking about any information on her son.  She had called the ED and they told her they can't give her any information.  Chaplain informed her that she does not have any information.  She received a call at that time.  Chaplain will return tomorrow to discuss HCPOA.

## 2022-03-04 DIAGNOSIS — J9611 Chronic respiratory failure with hypoxia: Secondary | ICD-10-CM | POA: Diagnosis not present

## 2022-03-04 DIAGNOSIS — F419 Anxiety disorder, unspecified: Secondary | ICD-10-CM | POA: Diagnosis not present

## 2022-03-04 DIAGNOSIS — I5032 Chronic diastolic (congestive) heart failure: Secondary | ICD-10-CM | POA: Diagnosis not present

## 2022-03-04 DIAGNOSIS — N179 Acute kidney failure, unspecified: Secondary | ICD-10-CM | POA: Diagnosis not present

## 2022-03-04 LAB — CBC WITH DIFFERENTIAL/PLATELET
Abs Immature Granulocytes: 0.11 10*3/uL — ABNORMAL HIGH (ref 0.00–0.07)
Basophils Absolute: 0.1 10*3/uL (ref 0.0–0.1)
Basophils Relative: 1 %
Eosinophils Absolute: 0.5 10*3/uL (ref 0.0–0.5)
Eosinophils Relative: 7 %
HCT: 36.5 % (ref 36.0–46.0)
Hemoglobin: 11.3 g/dL — ABNORMAL LOW (ref 12.0–15.0)
Immature Granulocytes: 1 %
Lymphocytes Relative: 26 %
Lymphs Abs: 2.1 10*3/uL (ref 0.7–4.0)
MCH: 26.6 pg (ref 26.0–34.0)
MCHC: 31 g/dL (ref 30.0–36.0)
MCV: 85.9 fL (ref 80.0–100.0)
Monocytes Absolute: 0.5 10*3/uL (ref 0.1–1.0)
Monocytes Relative: 7 %
Neutro Abs: 4.8 10*3/uL (ref 1.7–7.7)
Neutrophils Relative %: 58 %
Platelets: 210 10*3/uL (ref 150–400)
RBC: 4.25 MIL/uL (ref 3.87–5.11)
RDW: 18.5 % — ABNORMAL HIGH (ref 11.5–15.5)
WBC: 8.1 10*3/uL (ref 4.0–10.5)
nRBC: 0 % (ref 0.0–0.2)

## 2022-03-04 LAB — COMPREHENSIVE METABOLIC PANEL
ALT: 16 U/L (ref 0–44)
AST: 17 U/L (ref 15–41)
Albumin: 2.8 g/dL — ABNORMAL LOW (ref 3.5–5.0)
Alkaline Phosphatase: 60 U/L (ref 38–126)
Anion gap: 5 (ref 5–15)
BUN: 6 mg/dL — ABNORMAL LOW (ref 8–23)
CO2: 26 mmol/L (ref 22–32)
Calcium: 9 mg/dL (ref 8.9–10.3)
Chloride: 111 mmol/L (ref 98–111)
Creatinine, Ser: 0.57 mg/dL (ref 0.44–1.00)
GFR, Estimated: >60 mL/min (ref >60)
Glucose, Bld: 99 mg/dL (ref 70–99)
Potassium: 4.2 mmol/L (ref 3.5–5.1)
Sodium: 142 mmol/L (ref 135–145)
Total Bilirubin: 0.3 mg/dL (ref 0.3–1.2)
Total Protein: 5.6 g/dL — ABNORMAL LOW (ref 6.5–8.1)

## 2022-03-04 LAB — MAGNESIUM: Magnesium: 2 mg/dL (ref 1.7–2.4)

## 2022-03-04 LAB — PHOSPHORUS: Phosphorus: 3.7 mg/dL (ref 2.5–4.6)

## 2022-03-04 NOTE — Discharge Summary (Signed)
                                                  Against Medical Advice Patient at this time expresses desire to leave the Hospital immidiately, patient has been warned that this is not Medically advisable at this time, and can result in Medical complications like Death and Disability, patient understands and accepts the risks involved and assumes full responsibilty of this decision.  This patient has also been advised that if they feel the need for further medical assistance to return to any available ER or dial [phone].  Informed by Nursing staff that this patient has left care and has signed the form  Against Medical Advice on 03/04/2022 at 4:22 PM  Dr Lyda Jester, [facility] [place]
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
## 2022-03-04 NOTE — Progress Notes (Signed)
SATURATION QUALIFICATIONS: (This note is used to comply with regulatory documentation for home oxygen)  Patient Saturations on Room Air at Rest = 89%  Patient Saturations on Room Air while Ambulating = 82%  Patient Saturations on 3 Liters of oxygen while Ambulating = 98%  Please briefly explain why patient needs home oxygen: Hypoxia @rest 

## 2022-11-02 DEATH — deceased
